# Patient Record
Sex: Male | Born: 2000 | Marital: Single | Smoking: Never smoker
Health system: Southern US, Community
[De-identification: ages and names within clinical notes are randomized; demographics above are authoritative.]

## PROBLEM LIST (undated history)

## (undated) DIAGNOSIS — L709 Acne, unspecified: Secondary | ICD-10-CM

## (undated) HISTORY — PX: NO PAST SURGERIES: SHX2092

---
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
## 2021-04-25 ENCOUNTER — Encounter: Payer: Self-pay | Admitting: Emergency Medicine

## 2021-04-25 ENCOUNTER — Other Ambulatory Visit: Payer: Self-pay

## 2021-04-25 ENCOUNTER — Ambulatory Visit
Admission: EM | Admit: 2021-04-25 | Discharge: 2021-04-25 | Disposition: A | Discharge status: Home / Self Care | Payer: 59 | Attending: Sports Medicine | Admitting: Sports Medicine

## 2021-04-25 DIAGNOSIS — B9789 Other viral agents as the cause of diseases classified elsewhere: Secondary | ICD-10-CM

## 2021-04-25 DIAGNOSIS — J358 Other chronic diseases of tonsils and adenoids: Secondary | ICD-10-CM

## 2021-04-25 DIAGNOSIS — J038 Acute tonsillitis due to other specified organisms: Secondary | ICD-10-CM

## 2021-04-25 DIAGNOSIS — J029 Acute pharyngitis, unspecified: Secondary | ICD-10-CM

## 2021-04-25 LAB — GROUP A STREP BY PCR: Group A Strep by PCR: NOT DETECTED

## 2021-04-25 NOTE — ED Triage Notes (Signed)
Pt c/o sore throat and white patches on his throat. Started yesterday.

## 2021-04-25 NOTE — Discharge Instructions (Addendum)
Your strep test was negative. Please see educational handouts. Supportive care, over-the-counter meds as needed, Tylenol or Motrin for any fever or discomfort.  Throat lozenges or salt water gargles.  Plenty of rest and plenty of fluids. If symptoms persist please see your pediatrician. If they worsen please go to the ER. As we discussed I could have done a Monospot test today but I am not sure it would change management.  If his symptoms persist that could be something you could look at in the future.

## 2021-04-25 NOTE — ED Provider Notes (Signed)
[facility]    CSN: [id number] Arrival date & time: 04/25/21  0818      History   Chief Complaint Chief Complaint  Patient presents with  . Sore Throat    HPI Scott Burnett is a 20 y.o. male.   Pleasant 20 year old male who presents for evaluation of the above issue.  Normally sees his pediatrician in [place].  They were unavailable to see him today.  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  He is noted a sore throat with some white patches that began yesterday.  He is not having a lot of pain with swallowing.  No fever shakes chills.  No nausea vomiting diarrhea.  No abdominal urinary symptoms.  No strep or mono exposure.  He is fairly healthy.  No history of asthma.  Does not take medicines on a regular basis.  He has not been vaccinated against COVID.  He did get his flu shot.  No COVID exposure or COVID history.  No chest pain or shortness of breath.  No red flag signs or symptoms elicited on history.      Past Medical History:  Diagnosis Date  . Acne     There are no problems to display for this patient.   Past Surgical History:  Procedure Laterality Date  . NO PAST SURGERIES         Home Medications    Prior to Admission medications   Medication Sig Start Date End Date Taking? Authorizing Provider  amoxicillin (AMOXIL) 500 MG capsule Take 1 capsule (500 mg total) by mouth 3 (three) times daily. 10/12/20   Rodriguez-Southworth, Nettie Elm, PA-C  ISOtretinoin (ACCUTANE) 40 MG capsule Take 120 mg by mouth daily.    [provider]  loratadine (CLARITIN) 10 MG tablet Take 1 tablet (10 mg total) by mouth daily. 06/06/16 06/08/20  Renford Dills, NP    Family History Family History  Problem Relation Age of Onset  . Healthy Mother   . Healthy Father     Social History Social History   Tobacco Use  . Smoking status: Never Smoker  . Smokeless tobacco: Never Used  Vaping Use  . Vaping Use: Never used  Substance Use Topics  . Alcohol use: No  .  Drug use: Never     Allergies   Patient has no known allergies.   Review of Systems Review of Systems  Constitutional: Negative for chills, diaphoresis, fatigue and fever.  HENT: Positive for sore throat. Negative for congestion, ear pain, postnasal drip, rhinorrhea, sinus pressure, sinus pain and sneezing.   Eyes: Negative for pain.  Respiratory: Negative for cough, chest tightness, shortness of breath and wheezing.   Cardiovascular: Negative for chest pain and palpitations.  Gastrointestinal: Negative for abdominal pain, diarrhea, nausea and vomiting.  Genitourinary: Negative for dysuria.  Musculoskeletal: Negative for back pain, myalgias and neck pain.  Skin: Negative for color change, pallor, rash and wound.  Neurological: Negative for dizziness, light-headedness and headaches.  All other systems reviewed and are negative.    Physical Exam Triage Vital Signs ED Triage Vitals  Enc Vitals Group     BP 04/25/21 0849 129/86     Pulse Rate 04/25/21 0849 92     Resp 04/25/21 0849 18     Temp 04/25/21 0849 98.6 F (37 C)     Temp Source 04/25/21 0849 Oral     SpO2 04/25/21 0849 100 %     Weight 04/25/21 0848 235 lb (106.6 kg)  Height 04/25/21 0848 6' (1.829 m)     Head Circumference --      Peak Flow --      Pain Score 04/25/21 0848 3     Pain Loc --      Pain Edu? --      Excl. in GC? --    No data found.  Updated Vital Signs BP 129/86 (BP Location: Right Arm)   Pulse 92   Temp 98.6 F (37 C) (Oral)   Resp 18   Ht 6' (1.829 m)   Wt 106.6 kg   SpO2 100%   BMI 31.87 kg/m   Visual Acuity Right Eye Distance:   Left Eye Distance:   Bilateral Distance:    Right Eye Near:   Left Eye Near:    Bilateral Near:     Physical Exam Vitals and nursing note reviewed.  Constitutional:      General: He is not in acute distress.    Appearance: Normal appearance. He is well-developed. He is not ill-appearing, toxic-appearing or diaphoretic.  HENT:     Head:  Normocephalic and atraumatic.     Right Ear: Tympanic membrane normal.     Left Ear: Tympanic membrane normal.     Nose: Nose normal. No congestion or rhinorrhea.     Mouth/Throat:     Mouth: Mucous membranes are moist.     Pharynx: Uvula midline. Posterior oropharyngeal erythema present. No pharyngeal swelling or uvula swelling.     Tonsils: Tonsillar exudate present. No tonsillar abscesses. 1+ on the right. 1+ on the left.  Eyes:     Extraocular Movements:     Right eye: Normal extraocular motion.     Left eye: Normal extraocular motion.     Conjunctiva/sclera: Conjunctivae normal.     Pupils: Pupils are equal, round, and reactive to light.  Cardiovascular:     Rate and Rhythm: Normal rate and regular rhythm.     Pulses: Normal pulses.     Heart sounds: Normal heart sounds. No murmur heard. No friction rub. No gallop.   Pulmonary:     Effort: Pulmonary effort is normal.     Breath sounds: Normal breath sounds. No stridor. No wheezing, rhonchi or rales.  Musculoskeletal:     Cervical back: Normal range of motion and neck supple. No rigidity or tenderness.  Lymphadenopathy:     Cervical: Cervical adenopathy present.  Skin:    General: Skin is warm and dry.     Capillary Refill: Capillary refill takes less than 2 seconds.  Neurological:     General: No focal deficit present.     Mental Status: He is alert and oriented to person, place, and time.      UC Treatments / Results  Labs (all labs ordered are listed, but only abnormal results are displayed) Labs Reviewed  GROUP A STREP BY PCR    EKG   Radiology No results found.  Procedures Procedures (including critical care time)  Medications Ordered in UC Medications - No data to display  Initial Impression / Assessment and Plan / UC Course  I have reviewed the triage vital signs and the nursing notes.  Pertinent labs & imaging results that were available during my care of the patient were reviewed by me and  considered in my medical decision making (see chart for details).  Clinical impression: Sore throat with exudate for 2 days now.  Examination is reassuring other than some discomfort in his neck with cervical lymphadenopathy.  Treatment  plan: 1.  The findings and treatment plan were discussed in detail with the patient.  Patient was in agreement. 2.  We will get a strep test.  It was negative. 3.  Educational handouts provided. 4.  Plenty of rest, plenty fluids, Tylenol or Motrin for any fever or discomfort.  Throat lozenges and salt water gargles. 5.  If symptoms persist he should see his pediatrician. 6.  If they worsen he should go to the ER. 7.  We discussed doing a further work-up including a monotest, but he is stable enough and will hold on that. 8.  He was stable on discharge and will follow-up here as needed.    Final Clinical Impressions(s) / UC Diagnoses   Final diagnoses:  Pharyngitis, unspecified etiology  Acute viral tonsillitis  Tonsillar exudate     Discharge Instructions     Your strep test was negative. Please see educational handouts. Supportive care, over-the-counter meds as needed, Tylenol or Motrin for any fever or discomfort.  Throat lozenges or salt water gargles.  Plenty of rest and plenty of fluids. If symptoms persist please see your pediatrician. If they worsen please go to the ER. As we discussed I could have done a Monospot test today but I am not sure it would change management.  If his symptoms persist that could be something you could look at in the future.    ED Prescriptions    None     PDMP not reviewed this encounter.   Delton See, MD 04/25/21 [phone]

## 2021-06-03 ENCOUNTER — Other Ambulatory Visit: Payer: Self-pay

## 2021-06-03 ENCOUNTER — Ambulatory Visit
Admission: EM | Admit: 2021-06-03 | Discharge: 2021-06-03 | Disposition: A | Discharge status: Home / Self Care | Payer: 59 | Attending: Family Medicine | Admitting: Family Medicine

## 2021-06-03 DIAGNOSIS — J039 Acute tonsillitis, unspecified: Secondary | ICD-10-CM | POA: Diagnosis present

## 2021-06-03 LAB — GROUP A STREP BY PCR: Group A Strep by PCR: NOT DETECTED

## 2021-06-03 MED ORDER — AMOXICILLIN-POT CLAVULANATE 875-125 MG PO TABS: 1.0000 | ORAL_TABLET | Freq: Two times a day (BID) | ORAL | 0 refills | Status: DC

## 2021-06-03 NOTE — ED Triage Notes (Signed)
Patient states that he has been having a sore throat since Saturday. States that he gets this often and would like a referral to ENT if possible.

## 2021-06-03 NOTE — ED Provider Notes (Signed)
[facility]    CSN: [id number] Arrival date & time: 06/03/21  0955      History   Chief Complaint Chief Complaint  Patient presents with   Sore Throat    HPI Scott Burnett is a 20 y.o. male.   HPI  20 year old male here for evaluation of sore throat.  Patient was he had a sore throat for the last 3 days.  He has frequent tonsillitis and he looked in the back of his throat so they were red with white spots.  He denies any upper or lower respiratory symptoms.,  As well as GI symptoms or headache.  Past Medical History:  Diagnosis Date   Acne     There are no problems to display for this patient.   Past Surgical History:  Procedure Laterality Date   NO PAST SURGERIES         Home Medications    Prior to Admission medications   Medication Sig Start Date End Date Taking? Authorizing Provider  amoxicillin-clavulanate (AUGMENTIN) 875-125 MG tablet Take 1 tablet by mouth every 12 (twelve) hours. 06/03/21  Yes Becky Augusta, NP  loratadine (CLARITIN) 10 MG tablet Take 1 tablet (10 mg total) by mouth daily. 06/06/16 06/08/20  Renford Dills, NP    Family History Family History  Problem Relation Age of Onset   Healthy Mother    Healthy Father     Social History Social History   Tobacco Use   Smoking status: Never   Smokeless tobacco: Never  Vaping Use   Vaping Use: Never used  Substance Use Topics   Alcohol use: No   Drug use: Never     Allergies   Patient has no known allergies.   Review of Systems Review of Systems  Constitutional:  Negative for activity change, appetite change and fever.  HENT:  Positive for sore throat. Negative for congestion, ear pain and rhinorrhea.   Respiratory:  Negative for cough, shortness of breath and wheezing.   Gastrointestinal:  Negative for abdominal pain, diarrhea, nausea and vomiting.  Neurological:  Negative for headaches.    Physical Exam Triage Vital Signs ED Triage Vitals  Enc Vitals Group      BP 06/03/21 1049 (!) 127/91     Pulse Rate 06/03/21 1049 (!) 103     Resp 06/03/21 1049 18     Temp 06/03/21 1049 98.3 F (36.8 C)     Temp Source 06/03/21 1049 Oral     SpO2 06/03/21 1049 99 %     Weight 06/03/21 1048 209 lb 14.1 oz (95.2 kg)     Height 06/03/21 1048 6' (1.829 m)     Head Circumference --      Peak Flow --      Pain Score 06/03/21 1047 8     Pain Loc --      Pain Edu? --      Excl. in GC? --    No data found.  Updated Vital Signs BP (!) 127/91 (BP Location: Left Arm)   Pulse (!) 103   Temp 98.3 F (36.8 C) (Oral)   Resp 18   Ht 6' (1.829 m)   Wt 209 lb 14.1 oz (95.2 kg)   SpO2 99%   BMI 28.46 kg/m   Visual Acuity Right Eye Distance:   Left Eye Distance:   Bilateral Distance:    Right Eye Near:   Left Eye Near:    Bilateral Near:     Physical Exam Vitals  and nursing note reviewed.  Constitutional:      Appearance: Normal appearance. He is normal weight.  HENT:     Head: Normocephalic and atraumatic.     Right Ear: Tympanic membrane, ear canal and external ear normal. There is no impacted cerumen.     Left Ear: Tympanic membrane, ear canal and external ear normal. There is no impacted cerumen.     Nose: Nose normal.     Mouth/Throat:     Pharynx: Oropharyngeal exudate and posterior oropharyngeal erythema present.  Cardiovascular:     Rate and Rhythm: Normal rate and regular rhythm.     Pulses: Normal pulses.     Heart sounds: Normal heart sounds. No murmur heard. Pulmonary:     Effort: Pulmonary effort is normal.     Breath sounds: Normal breath sounds. No wheezing, rhonchi or rales.  Musculoskeletal:     Cervical back: Normal range of motion.  Lymphadenopathy:     Cervical: No cervical adenopathy.  Skin:    General: Skin is warm and dry.     Capillary Refill: Capillary refill takes less than 2 seconds.     Findings: No rash.  Neurological:     General: No focal deficit present.     Mental Status: He is alert and oriented to person,  place, and time.  Psychiatric:        Mood and Affect: Mood normal.        Behavior: Behavior normal.        Thought Content: Thought content normal.        Judgment: Judgment normal.     UC Treatments / Results  Labs (all labs ordered are listed, but only abnormal results are displayed) Labs Reviewed  GROUP A STREP BY PCR    EKG   Radiology No results found.  Procedures Procedures (including critical care time)  Medications Ordered in UC Medications - No data to display  Initial Impression / Assessment and Plan / UC Course  I have reviewed the triage vital signs and the nursing notes.  Pertinent labs & imaging results that were available during my care of the patient were reviewed by me and considered in my medical decision making (see chart for details).  This is a very pleasant, nontoxic-appearing 20 year old male here for evaluation of sore throat as outlined in HPI above.  Patient's physical exam reveals protegrin tympanic membranes bilaterally with a normal light reflex and clear external auditory canals.  Oropharyngeal exam reveals 2+ edematous and erythematous tonsillar pillars with whitish-green exudate.  Patient does not have any cervical lymphadenopathy.  Cardiopulmonary dam is benign.  Strep PCR collected at triage is negative.  We will treat patient for exudative tonsillitis with Augmentin twice daily for 7 days.  Patient requesting a referral to ENT and advised him to follow-up with his PCP for that.   Final Clinical Impressions(s) / UC Diagnoses   Final diagnoses:  Exudative tonsillitis     Discharge Instructions      Take the Augmentin twice daily for 7 days for treatment of your strep throat.  Gargle with warm salt water 2-3 times a day to soothe your throat, aid in pain relief, and aid in healing.  Take over-the-counter ibuprofen according to the package instructions as needed for pain.  You can also use Chloraseptic or Sucrets lozenges, 1 lozenge  every 2 hours as needed for throat pain.  If you develop any new or worsening symptoms return for reevaluation.  ED Prescriptions     Medication Sig Dispense Auth. Provider   amoxicillin-clavulanate (AUGMENTIN) 875-125 MG tablet Take 1 tablet by mouth every 12 (twelve) hours. 14 tablet Becky Augusta, NP      PDMP not reviewed this encounter.   Becky Augusta, NP 06/03/21 [phone]

## 2021-06-03 NOTE — Discharge Instructions (Addendum)
Take the Augmentin twice daily for 7 days for treatment of your strep throat.  Gargle with warm salt water 2-3 times a day to soothe your throat, aid in pain relief, and aid in healing.  Take over-the-counter ibuprofen according to the package instructions as needed for pain.  You can also use Chloraseptic or Sucrets lozenges, 1 lozenge every 2 hours as needed for throat pain.  If you develop any new or worsening symptoms return for reevaluation.  

## 2021-06-07 ENCOUNTER — Ambulatory Visit
Admission: EM | Admit: 2021-06-07 | Discharge: 2021-06-07 | Disposition: A | Discharge status: Home / Self Care | Payer: 59 | Attending: Family Medicine | Admitting: Family Medicine

## 2021-06-07 ENCOUNTER — Other Ambulatory Visit: Payer: Self-pay

## 2021-06-07 DIAGNOSIS — B2790 Infectious mononucleosis, unspecified without complication: Secondary | ICD-10-CM | POA: Insufficient documentation

## 2021-06-07 LAB — MONONUCLEOSIS SCREEN: Mono Screen: POSITIVE — AB

## 2021-06-07 MED ORDER — LIDOCAINE VISCOUS HCL 2 % MT SOLN: OROMUCOSAL | 0 refills | Status: AC

## 2021-06-07 MED ORDER — PREDNISONE 50 MG PO TABS: ORAL_TABLET | ORAL | 0 refills | Status: AC

## 2021-06-07 NOTE — ED Provider Notes (Signed)
[facility]    CSN: [id number] Arrival date & time: 06/07/21  0806      History   Chief Complaint Chief Complaint  Patient presents with   Sore Throat    HPI 20 year old male presents with persistent sore throat.  Patient recently seen on 6/27.  Diagnosed with exudative tonsillitis.  He was treated empirically with Augmentin.  Patient states that he has not improved.  He states that the antibiotics do not seem to be working.  He continues to have severe sore throat, which is 9/10 in severity.  No documented fever.  No relieving factors.  No other complaints.  Home Medications    Prior to Admission medications   Medication Sig Start Date End Date Taking? Authorizing Provider  lidocaine (XYLOCAINE) 2 % solution Gargle 15 mL every 3 hours as needed. May swallow if desired. 06/07/21  Yes Terry Abila G, DO  predniSONE (DELTASONE) 50 MG tablet 1 tablet daily x 5 days 06/07/21  Yes Savaya Hakes G, DO  loratadine (CLARITIN) 10 MG tablet Take 1 tablet (10 mg total) by mouth daily. 06/06/16 06/08/20  Renford Dills, NP    Family History Family History  Problem Relation Age of Onset   Healthy Mother    Healthy Father     Social History Social History   Tobacco Use   Smoking status: Never   Smokeless tobacco: Never  Vaping Use   Vaping Use: Never used  Substance Use Topics   Alcohol use: No   Drug use: Never     Allergies   Patient has no known allergies.   Review of Systems Review of Systems  Constitutional:  Negative for fever.  HENT:  Positive for sore throat.     Physical Exam Triage Vital Signs ED Triage Vitals  Enc Vitals Group     BP 06/07/21 0818 97/74     Pulse Rate 06/07/21 0818 98     Resp 06/07/21 0818 18     Temp 06/07/21 0818 98.1 F (36.7 C)     Temp Source 06/07/21 0818 Oral     SpO2 06/07/21 0818 100 %     Weight 06/07/21 0817 230 lb (104.3 kg)     Height 06/07/21 0817 6' (1.829 m)     Head Circumference --      Peak Flow --       Pain Score 06/07/21 0817 9     Pain Loc --      Pain Edu? --      Excl. in GC? --    Updated Vital Signs BP 97/74 (BP Location: Left Arm)   Pulse 98   Temp 98.1 F (36.7 C) (Oral)   Resp 18   Ht 6' (1.829 m)   Wt 104.3 kg   SpO2 100%   BMI 31.19 kg/m   Visual Acuity Right Eye Distance:   Left Eye Distance:   Bilateral Distance:    Right Eye Near:   Left Eye Near:    Bilateral Near:     Physical Exam Vitals and nursing note reviewed.  Constitutional:      General: He is not in acute distress.    Appearance: Normal appearance. He is not ill-appearing.  HENT:     Head: Normocephalic and atraumatic.     Mouth/Throat:     Pharynx: Posterior oropharyngeal erythema present.     Tonsils: Tonsillar exudate present. 2+ on the right. 2+ on the left.  Eyes:     General:  Right eye: No discharge.        Left eye: No discharge.     Conjunctiva/sclera: Conjunctivae normal.  Pulmonary:     Effort: Pulmonary effort is normal.     Breath sounds: Normal breath sounds.  Neurological:     Mental Status: He is alert.  Psychiatric:        Mood and Affect: Mood normal.        Behavior: Behavior normal.     UC Treatments / Results  Labs (all labs ordered are listed, but only abnormal results are displayed) Labs Reviewed  MONONUCLEOSIS SCREEN - Abnormal; Notable for the following components:      Result Value   Mono Screen POSITIVE (*)    All other components within normal limits    EKG   Radiology No results found.  Procedures Procedures (including critical care time)  Medications Ordered in UC Medications - No data to display  Initial Impression / Assessment and Plan / UC Course  I have reviewed the triage vital signs and the nursing notes.  Pertinent labs & imaging results that were available during my care of the patient were reviewed by me and considered in my medical decision making (see chart for details).    20 year old male presents with infectious  mononucleosis.  Placing on prednisone given the severity of his symptoms.  Viscous lidocaine as directed.  Supportive care.  Final Clinical Impressions(s) / UC Diagnoses   Final diagnoses:  Infectious mononucleosis without complication, infectious mononucleosis due to unspecified organism     Discharge Instructions      Stop the antibiotics.  Medication as directed.  Take care  Dr. Adriana Simas    ED Prescriptions     Medication Sig Dispense Auth. Provider   predniSONE (DELTASONE) 50 MG tablet 1 tablet daily x 5 days 5 tablet Noreta Kue G, DO   lidocaine (XYLOCAINE) 2 % solution Gargle 15 mL every 3 hours as needed. May swallow if desired. 200 mL Tommie Sams, DO      PDMP not reviewed this encounter.   Tommie Sams, [place] 06/07/21 [phone]

## 2021-06-07 NOTE — ED Triage Notes (Signed)
Pt c/o chronic tonsil problems. Pt was seen recently and given augmentin, he states this is not helping his symptoms. Pt reports increased tonsil swelling and soreness. Pt also reports increased sweating when his tonsillitis flares.

## 2021-06-07 NOTE — Discharge Instructions (Addendum)
Stop the antibiotics.  Medication as directed.  Take care  Dr. Adriana Simas
